# Patient Record
Sex: Male | Born: 1980 | Race: White | Hispanic: No | Marital: Married | Smoking: Never smoker
Health system: Southern US, Community
[De-identification: ages and names within clinical notes are randomized; demographics above are authoritative.]

## PROBLEM LIST (undated history)

## (undated) DIAGNOSIS — I10 Essential (primary) hypertension: Secondary | ICD-10-CM

## (undated) DIAGNOSIS — T883XXA Malignant hyperthermia due to anesthesia, initial encounter: Secondary | ICD-10-CM

## (undated) DIAGNOSIS — J45909 Unspecified asthma, uncomplicated: Secondary | ICD-10-CM

## (undated) HISTORY — PX: LYMPHADENECTOMY: SHX15

---

## 2008-03-29 ENCOUNTER — Emergency Department
Admission: EM | Admit: 2008-03-29 | Disposition: A | Discharge status: Home / Self Care | Payer: Self-pay | Source: Ambulatory Visit

## 2009-03-27 ENCOUNTER — Emergency Department
Admission: EM | Admit: 2009-03-27 | Disposition: A | Discharge status: Home / Self Care | Payer: Self-pay | Source: Ambulatory Visit

## 2016-11-12 ENCOUNTER — Ambulatory Visit (INDEPENDENT_AMBULATORY_CARE_PROVIDER_SITE_OTHER): Payer: Worker's Compensation

## 2016-11-12 ENCOUNTER — Ambulatory Visit
Admission: EM | Admit: 2016-11-12 | Discharge: 2016-11-12 | Disposition: A | Discharge status: Home / Self Care | Payer: Worker's Compensation | Attending: Family Medicine | Admitting: Family Medicine

## 2016-11-12 DIAGNOSIS — M79641 Pain in right hand: Secondary | ICD-10-CM

## 2016-11-12 DIAGNOSIS — X503XXA Overexertion from repetitive movements, initial encounter: Secondary | ICD-10-CM

## 2016-11-12 DIAGNOSIS — S63650A Sprain of metacarpophalangeal joint of right index finger, initial encounter: Secondary | ICD-10-CM | POA: Diagnosis not present

## 2016-11-12 MED ORDER — NAPROXEN 500 MG PO TABS: 500.0000 mg | ORAL_TABLET | Freq: Two times a day (BID) | ORAL | 0 refills | Status: AC

## 2016-11-12 NOTE — ED Provider Notes (Signed)
[facility]    CSN: [id number] Arrival date & time: 11/12/16  1207     History   Chief Complaint Chief Complaint  Patient presents with  . Hand Pain    HPI Eric Booth is a 36 y.o. male.   HPI  This is a 36 year old male who presents with complaints of right hand pain indicating his second MCP joint both dorsally and volarly. He works at [employer]. He states he was lifting a large bag of bunions after lifting repetitively heavy vegetables last night around 5:20 PM. He states that he did not injure his hand but felt a burning sensation radiating through his index finger into his palm and also over the dorsum. He's been applying ice to his hand since the injury last night approximately every 2 hours. He has not taken any medications. He states it is uncomfortable to make a fist or extend his fingers fully. There is no swelling or discoloration present. He went to work this morning his supervisors that he could not perform his duties and he recommended that he be seen        History reviewed. No pertinent past medical history.  There are no active problems to display for this patient.   Past Surgical History:  Procedure Laterality Date  . LYMPHADENECTOMY         Home Medications    Prior to Admission medications   Medication Sig Start Date End Date Taking? Authorizing Provider  naproxen (NAPROSYN) 500 MG tablet Take 1 tablet (500 mg total) by mouth 2 (two) times daily. 11/12/16   Lutricia Feil, PA-C    Family History Family History  Problem Relation Age of Onset  . Bipolar disorder Mother     Social History Social History  Substance Use Topics  . Smoking status: Never Smoker  . Smokeless tobacco: Never Used  . Alcohol use Yes     Comment: occ     Allergies   Patient has no known allergies.   Review of Systems Review of Systems  Constitutional: Positive for activity change. Negative for appetite change, chills, fatigue  and fever.  Musculoskeletal: Positive for myalgias.  All other systems reviewed and are negative.    Physical Exam Triage Vital Signs ED Triage Vitals  Enc Vitals Group     BP 11/12/16 1246 104/72     Pulse Rate 11/12/16 1246 80     Resp 11/12/16 1246 16     Temp 11/12/16 1246 98.7 F (37.1 C)     Temp Source 11/12/16 1246 Oral     SpO2 11/12/16 1246 96 %     Weight 11/12/16 1249 146 lb (66.2 kg)     Height 11/12/16 1249 6' (1.829 m)     Head Circumference --      Peak Flow --      Pain Score 11/12/16 1249 5     Pain Loc --      Pain Edu? --      Excl. in GC? --    No data found.   Updated Vital Signs BP 104/72 (BP Location: Left Arm)   Pulse 80   Temp 98.7 F (37.1 C) (Oral)   Resp 16   Ht 6' (1.829 m)   Wt 146 lb (66.2 kg)   SpO2 96%   BMI 19.80 kg/m   Visual Acuity Right Eye Distance:   Left Eye Distance:   Bilateral Distance:    Right Eye Near:   Left Eye  Near:    Bilateral Near:     Physical Exam  Constitutional: He appears well-developed and well-nourished. No distress.  HENT:  Head: Normocephalic.  Eyes: Pupils are equal, round, and reactive to light.  Neck: Normal range of motion. Neck supple.  Musculoskeletal: He exhibits tenderness. He exhibits no deformity.  Semination of the right dominant hand is very mild swelling over the second MCP joint. There is no ecchymosis or erythema present. He has fairly good range of motion passively and less so actively. FDS and FDP are intact and strong. There is mild tenderness to bilateral compression of the index MCP joint. He has good extension of his fingers and hand is strong to clinical stressing as well. His wrist range of motion is full and comfortable as his elbow and shoulder. Sensation is intact to light touch  Skin: He is not diaphoretic.  Nursing note and vitals reviewed.    UC Treatments / Results  Labs (all labs ordered are listed, but only abnormal results are displayed) Labs Reviewed - No  data to display  EKG  EKG Interpretation None       Radiology Dg Hand Complete Right  Result Date: 11/12/2016 CLINICAL DATA:  Injured hand yesterday at work. Pain involving the second MCP joint. EXAM: RIGHT HAND - COMPLETE 3+ VIEW COMPARISON:  None. FINDINGS: The joint spaces are maintained. No acute fractures identified. Mild degenerative changes noted at the second MCP joint with spurring and possible small erosions. There is also a small suspected erosion along the ulnar aspect of the proximal phalanx of the ring finger. The wrist joints appear normal. IMPRESSION: No acute bony findings. 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 Electronically Signed   By: Rudie MeyerP.  Gallerani M.D.   On: 11/12/2016 13:51    Procedures Procedures (including critical care time)  Medications Ordered in UC Medications - No data to display A dorsal splint was applied over the index finger immobilizing the MCP joint  Initial Impression / Assessment and Plan / UC Course  I have reviewed the triage vital signs and the nursing notes.  Pertinent labs & imaging results that were available during my care of the patient were reviewed by me and considered in my medical decision making (see chart for details).    Plan: 1. Test/x-ray results and diagnosis reviewed with patient 2. rx as per orders; risks, benefits, potential side effects reviewed with patient 3. Recommend supportive treatment with the icing and use of the splint while at work. Restrictions were provided for 7 days. If the patient continues to have pain after 7 days he will follow-up with Glenard Haringommy Moore, FNP at [facility]. He was given a prescription for Naprosyn which she will use for discomfort and swelling. 4. F/u prn if symptoms worsen or don't improve     Final Clinical Impressions(s) / UC Diagnoses   Final diagnoses:  Sprain of metacarpophalangeal (MCP) joint of right index finger, initial encounter    New  Prescriptions Discharge Medication List as of 11/12/2016  2:15 PM    START taking these medications   Details  naproxen (NAPROSYN) 500 MG tablet Take 1 tablet (500 mg total) by mouth 2 (two) times daily., Starting Wed 11/12/2016, Normal         Controlled Substance Prescriptions [place] Controlled Substance Registry consulted? Not Applicable   Lutricia FeilRoemer, William P, PA-C 11/12/16 2131

## 2016-11-12 NOTE — ED Triage Notes (Signed)
36 year old Caucasian male is here today with complaints of right hand pain from picking up a large bag of onions at his job last night. He states right after he picked up the large bag of onions he felt pain over his right index knuckle. He states he did not hit his hand against anything. He states he has been icing the area with no relief. He states he has not taken any OTC medications for the pain either.

## 2016-11-12 NOTE — Discharge Instructions (Signed)
Follow up next week with Glenard Haringommy Moore, F[facility] at [facility]. Wear splint at work.

## 2017-08-16 ENCOUNTER — Emergency Department
Admission: EM | Admit: 2017-08-16 | Discharge: 2017-08-17 | Disposition: A | Discharge status: Home / Self Care | Payer: Self-pay | Attending: Emergency Medicine | Admitting: Emergency Medicine

## 2017-08-16 DIAGNOSIS — L500 Allergic urticaria: Secondary | ICD-10-CM | POA: Insufficient documentation

## 2017-08-16 DIAGNOSIS — T7840XA Allergy, unspecified, initial encounter: Secondary | ICD-10-CM

## 2017-08-16 HISTORY — DX: Essential (primary) hypertension: I10

## 2017-08-16 HISTORY — DX: Unspecified asthma, uncomplicated: J45.909

## 2017-08-16 MED ORDER — FAMOTIDINE 10 MG/ML IV SOLN (WRAP)
20.0000 mg | Freq: Once | INTRAVENOUS | Status: AC
Start: 2017-08-16 — End: 2017-08-16
  Administered 2017-08-16: 20 mg via INTRAVENOUS
  Filled 2017-08-16: qty 2

## 2017-08-16 MED ORDER — ALBUTEROL SULFATE (2.5 MG/3ML) 0.083% IN NEBU
2.5000 mg | INHALATION_SOLUTION | Freq: Once | RESPIRATORY_TRACT | Status: AC
Start: 2017-08-16 — End: 2017-08-16
  Administered 2017-08-16: 2.5 mg via RESPIRATORY_TRACT
  Filled 2017-08-16: qty 3

## 2017-08-16 MED ORDER — METHYLPREDNISOLONE SODIUM SUCC 125 MG IJ SOLR
125.0000 mg | Freq: Once | INTRAMUSCULAR | Status: AC
Start: 2017-08-16 — End: 2017-08-16
  Administered 2017-08-16: 125 mg via INTRAVENOUS
  Filled 2017-08-16: qty 2

## 2017-08-16 MED ORDER — PREDNISONE 20 MG PO TABS
60.0000 mg | ORAL_TABLET | Freq: Once | ORAL | Status: DC
Start: 2017-08-16 — End: 2017-08-16

## 2017-08-16 MED ORDER — IPRATROPIUM BROMIDE 0.02 % IN SOLN
0.5000 mg | Freq: Once | RESPIRATORY_TRACT | Status: AC
Start: 2017-08-16 — End: 2017-08-16
  Administered 2017-08-16: 0.5 mg via RESPIRATORY_TRACT
  Filled 2017-08-16: qty 2.5

## 2017-08-16 MED ORDER — SODIUM CHLORIDE 0.9 % IV BOLUS
1000.0000 mL | Freq: Once | INTRAVENOUS | Status: AC
Start: 2017-08-16 — End: 2017-08-17
  Administered 2017-08-16: 1000 mL via INTRAVENOUS

## 2017-08-16 NOTE — ED Provider Notes (Signed)
I evaluated the patient in the ED triage booth only. I transferred care to the next ED physician.   Time of Initial Evaluation:: 10:25 PM  Emergency Department - Initial Evaluation in Triage Only     Selected Historical Findings  Chief Complaint   Patient presents with   . Allergic Reaction     Ernest May is a 37 y.o. male who presents with suspected allergic reaction.  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  Ate 2pm.  Felt itchy.  Then better.  No worse.  Eye swelling. Mild DIB.  Took 50mg  benadryl ~ ago.  Past History  No past medical history on file.  No past surgical history on file.  PCP - No primary care provider on file.     Selected Physical Examination Findings  BP 118/66   Pulse 87   Resp 19   Ht 5\' 11"  (1.803 m)   Wt 72.6 kg   SpO2 97%   BMI 22.32 kg/m   Constitutional: Vital signs reviewed.   Eyes swollen.  Facial urticaria  Voice normal  OP minimally injected  No wheezing      Medical Decision Making/Comments:   Requested patient to be room.  Prednisone ordered.      I am not working with a Neurosurgeon.     Ruffin Pyo, MD  08/16/17 2238

## 2017-08-16 NOTE — ED Provider Notes (Addendum)
[facility] EMERGENCY DEPARTMENT H&P            CLINICAL INFORMATION        HPI:      Chief Complaint: Allergic Reaction  .    Ernest May is a 37 y.o. male with a pmhx of HTN who presents with a sudden onset of bilateral eye swelling a/w hives and generalized itching that started at 2pm.     Tinnie Gens ate at a [employer] today. Around 2pm, felt the reaction starting. His eyes became swollen and he felt as though he was itching all over. Took 50mg  of Benadryl around 2150 as he felt the reaction coming back.     Denies: difficulty breathing, known food allergies    History obtained from: Patient              ROS:        Review of Systems   Constitutional: Negative for fatigue and fever.   HENT: Negative for congestion, sore throat and trouble swallowing.    Eyes: Positive for pain. Negative for photophobia and redness.        Eye swelling   Respiratory: Negative for shortness of breath and wheezing.    Cardiovascular: Negative for chest pain and leg swelling.   Gastrointestinal: Negative for abdominal pain, nausea and vomiting.   Genitourinary: Negative for dysuria and urgency.   Musculoskeletal: Negative for back pain and joint swelling.   Skin: Positive for rash. Negative for color change.        Itching   Neurological: Negative for weakness and headaches.                 Physical Exam:      BP: 118/66, Heart Rate: 87, Resp Rate: 19, SpO2: 97 %, Height: 5\' 11"  (180.3 cm), Weight: 72.6 kg    Physical Exam   Constitutional: He is oriented to person, place, and time. He appears well-developed and well-nourished.   HENT:   Head: Normocephalic and atraumatic.   No swelling of the lips, tongue or soft palate   Eyes: Pupils are equal, round, and reactive to light. EOM are normal.   Periorbital swelling   Neck: Normal range of motion. Neck supple. No tracheal deviation present.   Cardiovascular: Normal rate, regular rhythm and normal heart sounds.    No murmur heard.  Pulmonary/Chest: Effort normal. No  respiratory distress. He has wheezes in the right lower field and the left lower field.   Abdominal: Soft. Bowel sounds are normal. He exhibits no distension. There is no tenderness.   Musculoskeletal: Normal range of motion.   Neurological: He is alert and oriented to person, place, and time.   Skin: Skin is warm and dry. Rash noted. Rash is urticarial (Diffuse, trunk, arms, legs).                     PAST HISTORY        Primary Care Provider: Pcp, None, MD        PMH/PSH:    .     Past Medical History:   Diagnosis Date   . Asthma    . Hypertension        He has no past surgical history on file.      Social/Family History:      He reports that he has never smoked. He has never used smokeless tobacco. He reports that he drinks alcohol. He reports that he does not use drugs.  History reviewed. No pertinent family history.      Listed Medications on Arrival:    .     Previous Medications    No medications on file      Allergies: He has No Known Allergies.            VISIT INFORMATION        Medications Given in the ED:    .     ED Medication Orders     Start Ordered     Status Ordering Provider    08/16/17 2241 08/16/17 2240  sodium chloride 0.9 % bolus 1,000 mL  Once     Route: Intravenous  Ordered Dose: 1,000 mL     Last MAR action:  New Bag Margarete Horace BRIAN    08/16/17 2241 08/16/17 2240  methylPREDNISolone sodium succinate (Solu-MEDROL) injection 125 mg  Once     Route: Intravenous  Ordered Dose: 125 mg     Last MAR action:  Given Mieka Leaton BRIAN    08/16/17 2241 08/16/17 2240  famotidine (PEPCID) injection 20 mg  Once     Route: Intravenous  Ordered Dose: 20 mg     Last MAR action:  Given Krysti Hickling BRIAN    08/16/17 2241 08/16/17 2240  albuterol (PROVENTIL) (2.5 MG/3ML) 0.083% nebulizer solution 2.5 mg  RT - Once     Route: Nebulization  Ordered Dose: 2.5 mg     Last MAR action:  Given Vollie Brunty BRIAN    08/16/17 2241 08/16/17 2240  ipratropium (ATROVENT) 0.02  % nebulizer solution 0.5 mg  RT - Once     Route: Nebulization  Ordered Dose: 0.5 mg     Last MAR action:  Given Daijanae Rafalski BRIAN    08/16/17 2230 08/16/17 2229    Once     Route: Oral  Ordered Dose: 60 mg     Discontinued AVSTREIH, DAN B            Procedures:      Procedures      Interpretations:      O2 sat-           saturation: 97 %; Oxygen use: room air; Interpretation: Normal                         CLINICAL SUMMARY          MDM Notes:      37 year old male presents ED with apparent allergic reaction, the patient has urticaria, he has some swelling of the eyes bilaterally, he has no signs of anaphylaxis including no swelling of the lips, tongue or soft palate.  He has no stridor.  He does have some minor wheezing, and a history of asthma we will give albuterol, prednisone, Pepcid.  Patient took Benadryl already.    .     ED Course:    Patient was observed for several hours in the ED, he had improvement in his symptoms, he never had any airway compromise, will be discharged home with Medrol Dosepak, Benadryl, EpiPen advised to follow-up with transitional care clinic and medicine for allergy testing.  Should return to the ED for new or concerning symptoms.                 Diagnosis:        Final diagnoses:   Allergic reaction, initial encounter         Disposition:      ED Disposition  ED Disposition Condition Date/Time Comment    Discharge  Mon Aug 17, 2017 12:39 AM Dimple Casey discharge to home/self care.    Condition at disposition: Stable          Condition at disposition:  Good    New Prescriptions    DIPHENHYDRAMINE (BENADRYL) 25 MG TABLET    Take 1 tablet (25 mg total) by mouth every 6 (six) hours as needed for Itching    EPINEPHRINE 0.3 MG/0.3ML SOLUTION AUTO-INJECTOR INJECTION    Inject 0.3 mLs (0.3 mg total) into the muscle as needed (for Anaphylaxis)    METHYLPREDNISOLONE (MEDROL DOSPACK) 4 MG TABLET    Use as directed               RESULTS        Lab Results:      Results      ** No results found for the last 24 hours. **              Radiology Results:      No orders to display             Scribe Attestation:      I was acting as a Neurosurgeon for Tera Partridge, MD on Dimple Casey  Treatment Team: Scribe: Courtney Heys    I am the first provider for this patient and I personally performed the services documented. Treatment Team: Scribe: Courtney Heys is scribing for me on Seamans,Jaikob ALAN. This note accurately reflects work and decisions made by me.  Tera Partridge, MD            Theresa Duty, MD  08/17/17 1610       Theresa Duty, MD  08/17/17 [phone]

## 2017-08-16 NOTE — ED Triage Notes (Signed)
States that he had seafood allergy until he was ~age 37.  Has been eating seafood weekly since w/ no problem

## 2017-08-17 MED ORDER — EPINEPHRINE 0.3 MG/0.3ML IJ SOAJ
0.3000 mg | INTRAMUSCULAR | 0 refills | Status: AC | PRN
Start: 2017-08-17 — End: ?

## 2017-08-17 MED ORDER — DIPHENHYDRAMINE HCL 25 MG PO TABS
25.0000 mg | ORAL_TABLET | Freq: Four times a day (QID) | ORAL | 0 refills | Status: AC | PRN
Start: 2017-08-17 — End: ?

## 2017-08-17 MED ORDER — METHYLPREDNISOLONE 4 MG PO TBPK
ORAL_TABLET | ORAL | 0 refills | Status: AC
Start: 2017-08-17 — End: ?

## 2017-08-17 NOTE — ED Notes (Signed)
Pt resting in stretcher, no distress noted at this time

## 2017-08-17 NOTE — Discharge Instructions (Signed)
Dear Mr. Ernest May:    Thank you for choosing the [facility] Emergency Department, the premier emergency department in the [place] area.  I hope your visit today was EXCELLENT.    Specific instructions for your visit today:      Allergic Reaction    You have been seen for an allergic reaction.    An allergic reaction is when your body reacts to something it comes in contact with. This can be something you ate. It can also be something that got on your skin or that you breathed in. Insect bites sometimes cause this reaction. Wasp, hornet and bee stings often cause this reaction.    Allergic reactions can cause a few things to happen. Some people get hives (large raised welts). Others get blistered skin. More serious reactions include swelling of the lips and/or tongue. They also include difficulty breathing. This can be with or without wheezing.    Often, the exact cause of the allergic reaction is never found.    If you find you are allergic to something, avoid it. Future allergic reactions could get much worse.    General treatment for an allergic reaction includes antihistamines like diphenhydramine (Benadryl) or prescription strength hydroxyzine (Atarax) and steroids. Some antacids also act like antihistamines. These include famotidine (Pepcid), ranitidine (Zantac) or cimetidine (Tagamet). These are often used to help with the allergic reaction.    If you get a steroid prescription, it is important to finish the entire prescription. The allergic reaction can come back suddenly (rebound) if you suddenly stop the steroid too early.    YOU SHOULD SEEK MEDICAL ATTENTION IMMEDIATELY, EITHER HERE OR AT THE NEAREST EMERGENCY DEPARTMENT, IF ANY OF THE FOLLOWING OCCURS:   Your lips or tongue get swollen.   You have trouble breathing or start wheezing.   Your rash seems to get infected. Signs of infection are skin redness, pain, pus, swelling or fever (temperature higher than 100.49F / 38C).    If you  do not continue to improve or your condition worsens, please contact your doctor or return immediately to the Emergency Department.    Sincerely,  Sponaugle, [name]*  Attending Emergency Physician  [facility] Emergency Department    ONSITE PHARMACY  Our full service onsite pharmacy is located in the ER waiting room.  Open 7 days a week from 9 am to 9 pm.  We accept all major insurances and prices are competitive with major retailers.  Ask your provider to print your prescriptions down to the pharmacy to speed you on your way home.    OBTAINING A PRIMARY CARE APPOINTMENT    Primary care physicians (PCPs, also known as primary care doctors) are either internists or family medicine doctors. Both types of PCPs focus on health promotion, disease prevention, patient education and counseling, and treatment of acute and chronic medical conditions.    Call for an appointment with a primary care doctor.  Ask to see who is taking new patients.     [facility]  telephone:  [phone]  [email or web address]    DOCTOR REFERRALS  Call [phone] (available 24 hours a day, 7 days a week) if you need any further referrals and we can help you find a primary care doctor or specialist.  Also, available online at:  [email or web address]    YOUR CONTACT INFORMATION  Before leaving please check with registration to make sure we have an up-to-date contact number.  You can call registration at [phone] to update  your information.  For questions about your hospital bill, please call [phone].  For questions about your Emergency Dept Physician bill please call [phone].      FREE HEALTH SERVICES  If you need help with health or social services, please call 2-1-1 for a free referral to resources in your area.  2-1-1 is a free service connecting people with information on health insurance, free clinics, pregnancy, mental health, dental care, food assistance, housing, and substance  abuse counseling.  Also, available online at:  http://www.[facility].org    MEDICAL RECORDS AND TESTS  Certain laboratory test results do not come back the same day, for example urine cultures.   We will contact you if other important findings are noted.  Radiology films are often reviewed again to ensure accuracy.  If there is any discrepancy, we will notify you.      Please call [phone] to pick up a complimentary CD of any radiology studies performed.  If you or your doctor would like to request a copy of your medical records, please call [phone].      ORTHOPEDIC INJURY   Please know that significant injuries can exist even when an initial x-ray is read as normal or negative.  This can occur because some fractures (broken bones) are not initially visible on x-rays.  For this reason, close outpatient follow-up with your primary care doctor or bone specialist (orthopedist) is required.    MEDICATIONS AND FOLLOWUP  Please be aware that some prescription medications can cause drowsiness.  Use caution when driving or operating machinery.    The examination and treatment you have received in our Emergency Department is provided on an emergency basis, and is not intended to be a substitute for your primary care physician.  It is important that your doctor checks you again and that you report any new or remaining problems at that time.      24 HOUR PHARMACIES  The nearest 24 hour pharmacy is:    [facility]  [address]  [place], [place] [address]  [phone]      ASSISTANCE WITH INSURANCE    Affordable Care Act  [facility])  Call to start or finish an application, compare plans, enroll or ask a question.  [phone]  TTY: 9375819080  Web:  Healthcare.gov    Help Enrolling in [facility]  [employer] [place]  985-483-6940 (TOLL-FREE)  [phone] (TTY)  Web:  Http://www.coverva.org    Local Help Enrolling in the [facility]  [place] [facility]  [phone] (MAIN)  Email:   health-help@nvfs .org  Web:  BlackjackMyths.is  Address:  [address] [place], [place] [address]    SEDATING MEDICATIONS  Sedating medications include strong pain medications (e.g. narcotics), muscle relaxers, benzodiazepines (used for anxiety and as muscle relaxers), Benadryl/diphenhydramine and other antihistamines for allergic reactions/itching, and other medications.  If you are unsure if you have received a sedating medication, please ask your physician or nurse.  If you received a sedating medication: DO NOT drive a car. DO NOT operate machinery. DO NOT perform jobs where you need to be alert.  DO NOT drink alcoholic beverages while taking this medicine.     If you get dizzy, sit or lie down at the first signs. Be careful going up and down stairs.  Be extra careful to prevent falls.     Never give this medicine to others.     Keep this medicine out of reach of children.  Do not take or save old medicines. Throw them away when outdated.     Keep all medicines in a cool, dry place. DO NOT keep them in your bathroom medicine cabinet or in a cabinet above the stove.    MEDICATION REFILLS  Please be aware that we cannot refill any prescriptions through the ER. If you need further treatment from what is provided at your ER visit, please follow up with your primary care doctor or your pain management specialist.    [place]  Did you know [facility] has two freestanding ERs located just a few miles away?  [facility] [place] have short wait times, easy free parking directly in front of the building and top patient satisfaction scores - and the same Board Certified Emergency Medicine doctors as [facility].

## 2018-08-10 IMAGING — CR DG HAND COMPLETE 3+V*R*
3 series · 3 of 3 positions shown · non-contrast
Comparison: None.

CLINICAL DATA: Injured hand yesterday at work. Pain involving the
second MCP joint.

EXAM:
RIGHT HAND - COMPLETE 3+ VIEW

[hand ap]
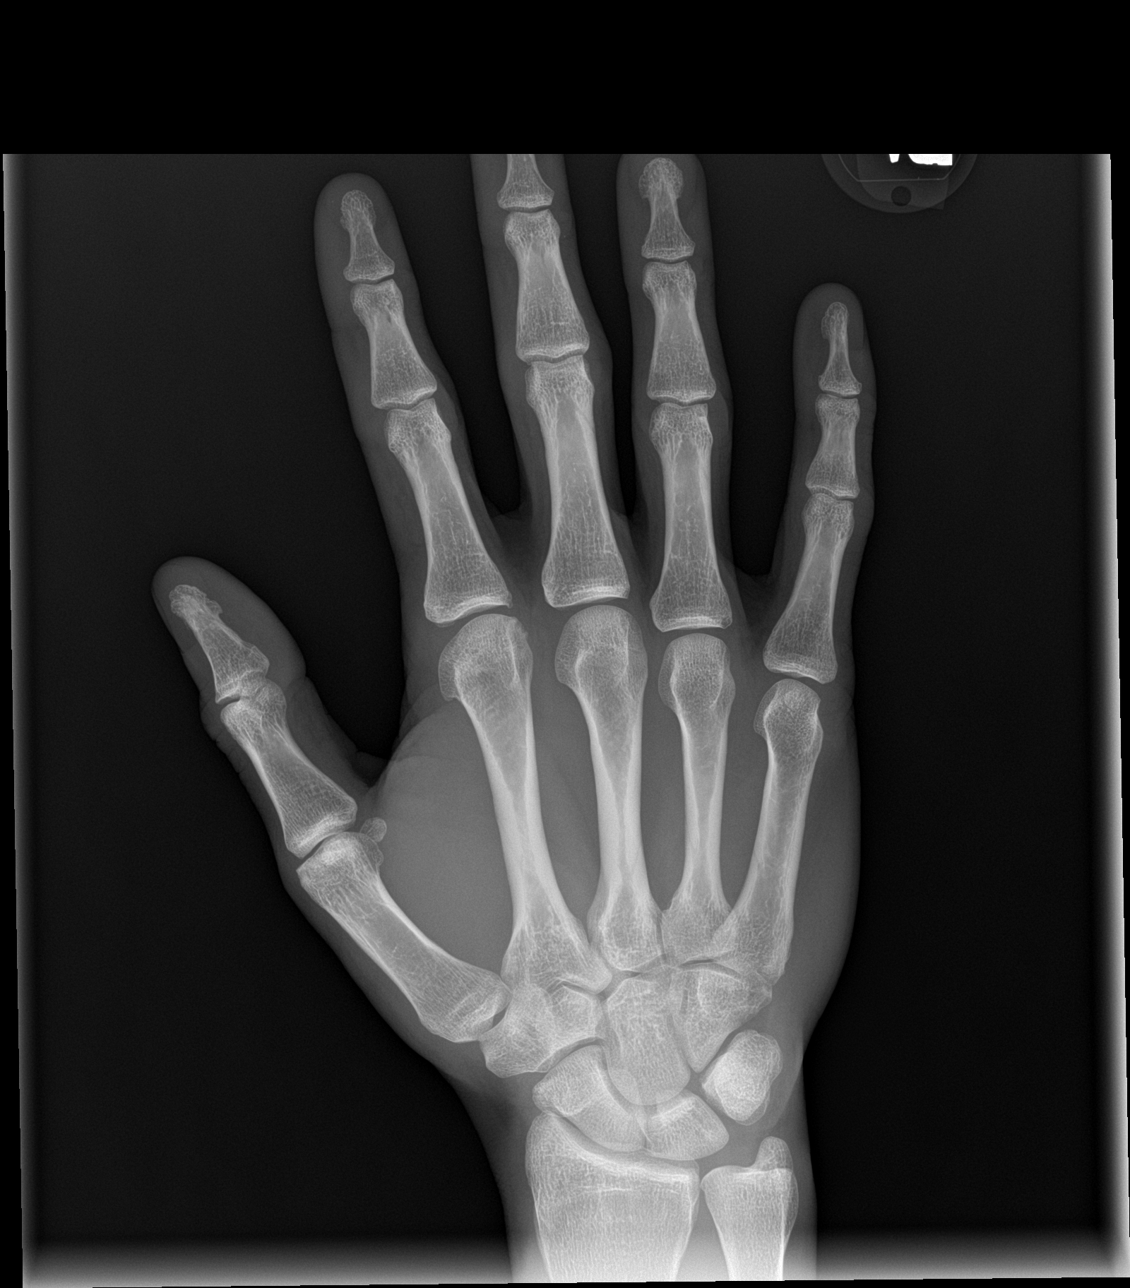

[hand obl]
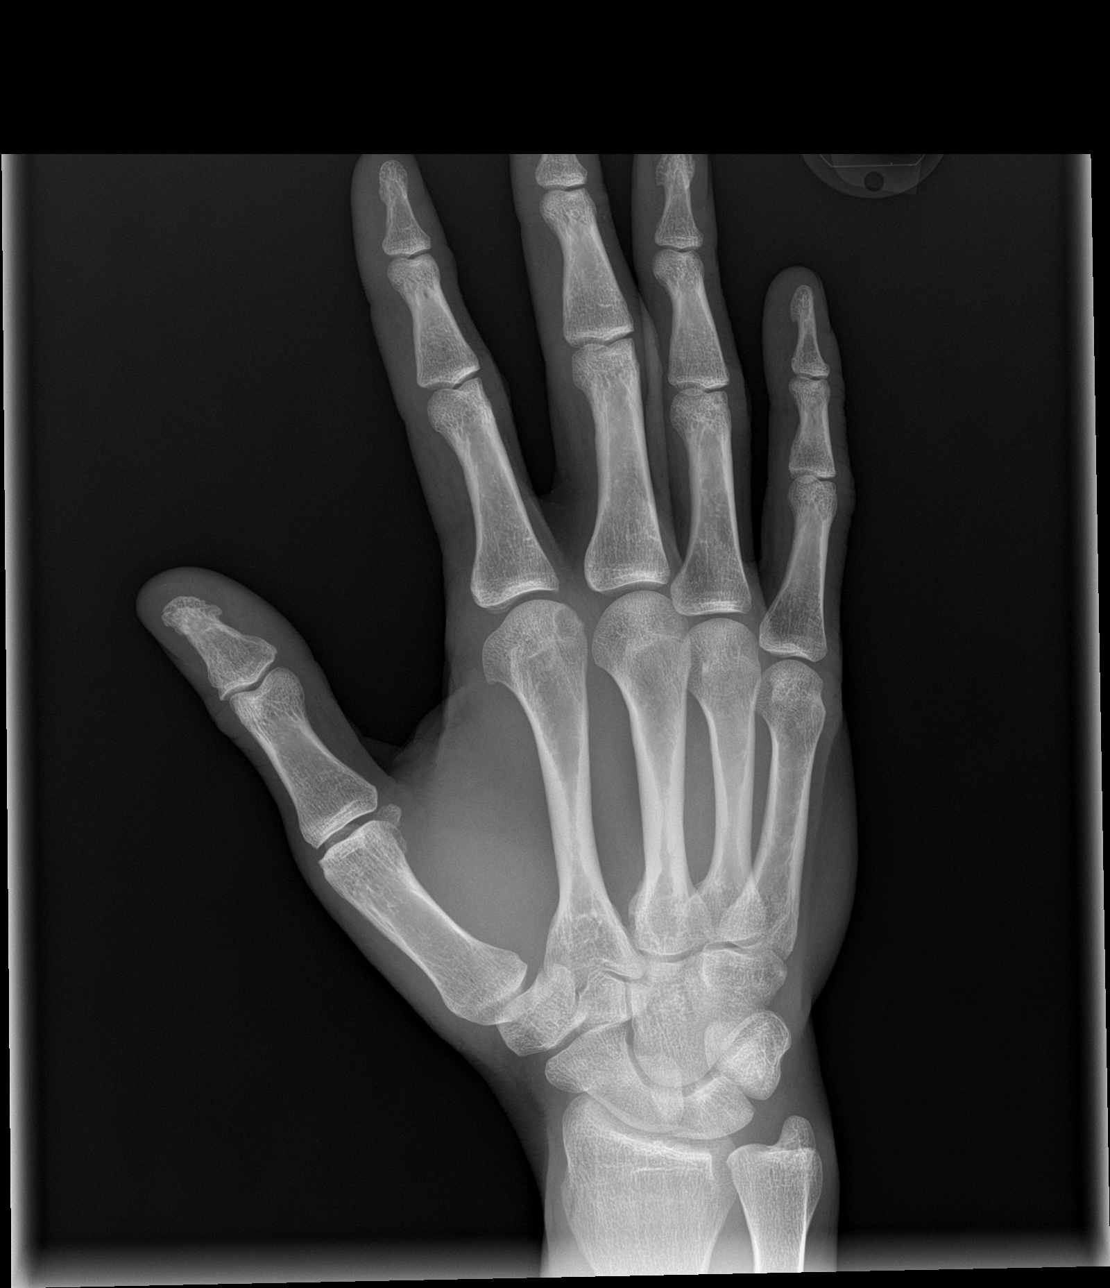

[hand lat]
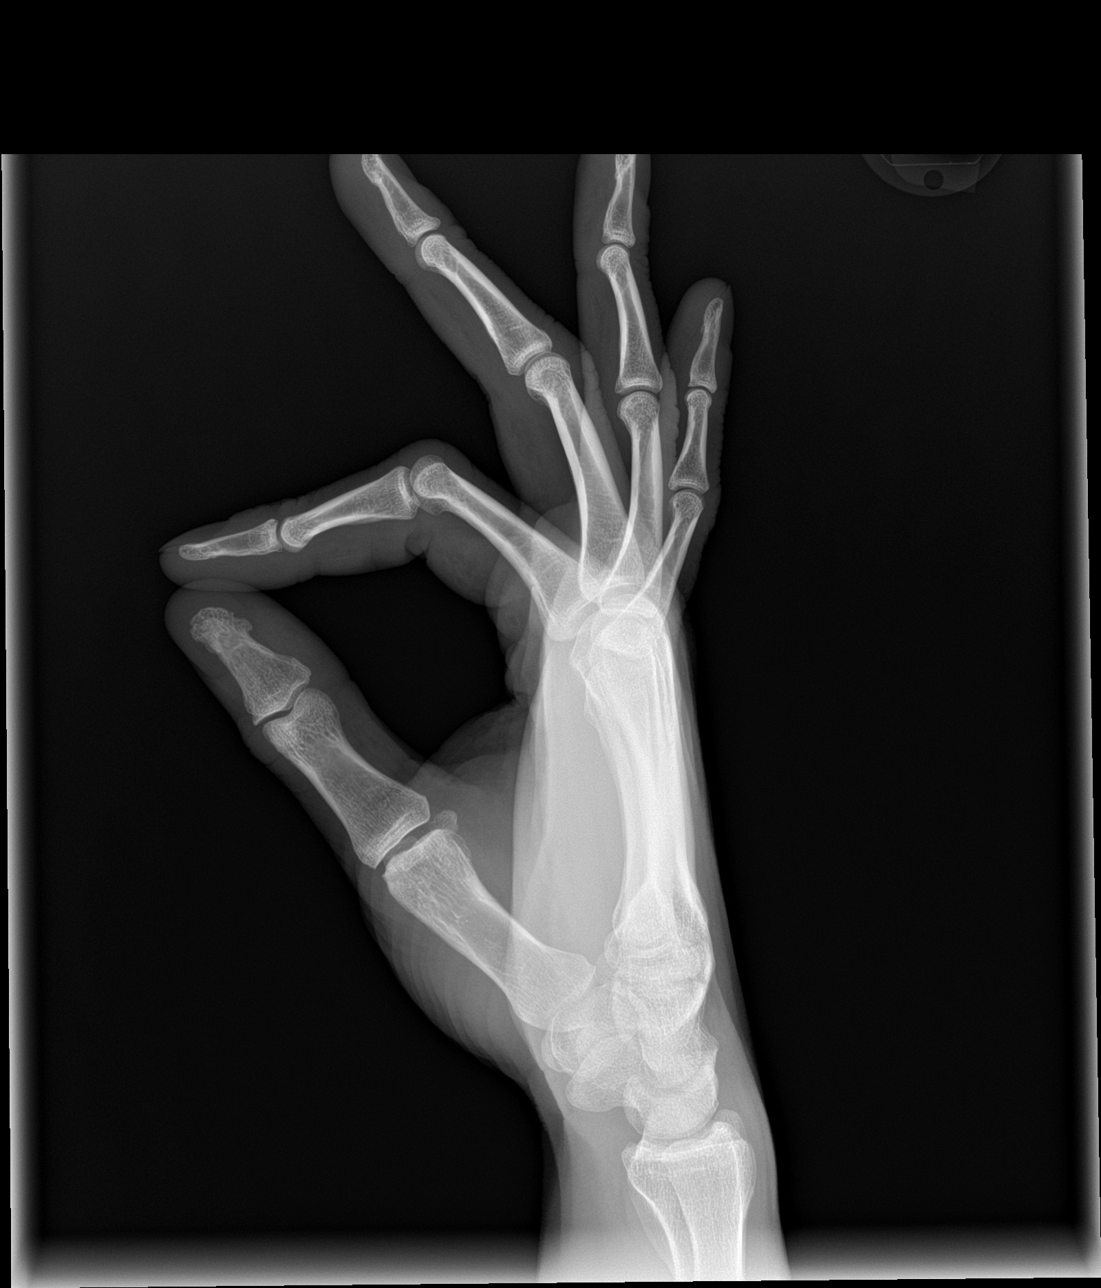

[3 of 3 positions shown; findings below may reference images not displayed]

FINDINGS: The joint spaces are maintained. No acute fractures identified. Mild
degenerative changes noted at the second MCP joint with spurring and
possible small erosions. There is also a small suspected erosion
along the ulnar aspect of the proximal phalanx of the ring finger.
The wrist joints appear normal.
IMPRESSION: No acute bony findings.

Degenerative changes at the second MCP joint.

## 2022-03-19 ENCOUNTER — Other Ambulatory Visit: Payer: Self-pay

## 2022-03-19 DIAGNOSIS — S83211A Bucket-handle tear of medial meniscus, current injury, right knee, initial encounter: Secondary | ICD-10-CM | POA: Insufficient documentation

## 2022-03-19 NOTE — H&P (Signed)
Patient Type: A    ATTENDING PHYSICIAN: Korin Hartwell W. Aylanie Cubillos    CHIEF COMPLAINT:  Right knee medial meniscus tear.    BRIEF HISTORY:  The patient is the 41 year old male with an injury to his right knee on   February 20, 2022.  He has an MRI which shows a bucket handle tear of his   medial meniscus and now presents for an elective right knee arthroscopy   with medial meniscectomy.    PAST MEDICAL HISTORY:  Significant for a family history of malignant hyperthermia, also has a   history of asthma, and hypertension.    PAST SURGICAL HISTORY:  Otherwise, negative.    MEDICATIONS:  None.    ALLERGIES:  No known drug allergies, but he does have a questionable allergy to   propofol and questionable history of malignant hyperthermia.    FAMILY HISTORY:  Significant for heart disease.    SOCIAL HISTORY:  Negative for tobacco and alcohol use.    REVIEW OF SYSTEMS:  Otherwise negative for glaucoma, sinusitis, heart disease, high blood   pressure, dermatitis, seizures, stomach ulcers, emphysema, rheumatoid   arthritis, anxiety, depression, thyroid disease, hepatitis, frequent   bladder infections, incontinence, cancer.    PHYSICAL EXAMINATION:  GENERAL:  Healthy male, in no acute distress.  VITAL SIGNS:  Height 5 feet 11 inches, weight is 167.  BMI is 23.  HEART:  Regular rate and rhythm.  LUNGS:  Clear to auscultation.  EXTREMITIES:  Examination of right knee today, there is no effusion.    Medial joint line is exquisitely tender to palpation.  Limited range of   motion from 10 to about 70 degrees of flexion.    DIAGNOSTIC DATA:  MRI of his right knee from [facility] clearly shows he has a displaced   bucket handle tear of his medial meniscus. No other obvious abnormalities   seen.    ASSESSMENT:  A 41 year old male with a right knee bucket handle tear of his medial   meniscus.  At this point, he is set up to have a right knee arthroscopy for  partial medial meniscectomy.  Risks of surgery including bleeding,   infection,  damage to blood vessels and nerves, deep venous thrombosis,   postoperative stiffness.  He understands and is set up for that on the day   of admission.      D: 03/19/2022 03:01 PM by Theador Hawthorne, MD [phone])  T: 03/19/2022 03:14 PM by [id number] (Conf: [id number]) (Doc ID:   [id number])

## 2022-03-23 ENCOUNTER — Ambulatory Visit
Admission: AD | Admit: 2022-03-23 | Discharge: 2022-03-23 | Disposition: A | Discharge status: Home / Self Care | Payer: BC Managed Care – PPO | Attending: Orthopaedic Surgery | Admitting: Orthopaedic Surgery

## 2022-03-23 ENCOUNTER — Ambulatory Visit: Payer: BC Managed Care – PPO | Admitting: Anesthesiology

## 2022-03-23 ENCOUNTER — Encounter: Payer: Self-pay | Admitting: Orthopaedic Surgery

## 2022-03-23 ENCOUNTER — Encounter
Admission: AD | Disposition: A | Discharge status: Home / Self Care | Payer: Self-pay | Source: Home / Self Care | Attending: Orthopaedic Surgery

## 2022-03-23 DIAGNOSIS — X58XXXA Exposure to other specified factors, initial encounter: Secondary | ICD-10-CM | POA: Insufficient documentation

## 2022-03-23 DIAGNOSIS — S83211A Bucket-handle tear of medial meniscus, current injury, right knee, initial encounter: Secondary | ICD-10-CM | POA: Insufficient documentation

## 2022-03-23 HISTORY — PX: ARTHROSCOPY KNEE, MENISECTOMY: SHX51039

## 2022-03-23 HISTORY — DX: Malignant hyperthermia due to anesthesia, initial encounter: T88.3XXA

## 2022-03-23 SURGERY — ARTHROSCOPY KNEE, MENISECTOMY
Anesthesia: Anesthesia General | Site: Knee | Laterality: Right | Wound class: Clean

## 2022-03-23 MED ORDER — OXYCODONE HCL 5 MG PO TABS
ORAL_TABLET | ORAL | Status: AC
Start: 2022-03-23 — End: 2022-03-23
  Administered 2022-03-23: 5 mg via ORAL
  Filled 2022-03-23: qty 1

## 2022-03-23 MED ORDER — STERILE WATER FOR INJECTION IJ/IV SOLN (WRAP)
2.0000 g | INTRAMUSCULAR | Status: AC
Start: 2022-03-23 — End: 2022-03-23
  Administered 2022-03-23: 2 g via INTRAVENOUS

## 2022-03-23 MED ORDER — PROPOFOL 10 MG/ML IV EMUL (WRAP)
INTRAVENOUS | Status: AC
Start: 2022-03-23 — End: ?
  Filled 2022-03-23: qty 20

## 2022-03-23 MED ORDER — LIDOCAINE HCL (PF) 1 % IJ SOLN
INTRAMUSCULAR | Status: AC
Start: 2022-03-23 — End: ?
  Filled 2022-03-23: qty 5

## 2022-03-23 MED ORDER — KETOROLAC TROMETHAMINE 30 MG/ML IJ SOLN
INTRAMUSCULAR | Status: AC
Start: 2022-03-23 — End: 2022-03-23
  Administered 2022-03-23: 30 mg via INTRAVENOUS
  Filled 2022-03-23: qty 1

## 2022-03-23 MED ORDER — FENTANYL CITRATE (PF) 50 MCG/ML IJ SOLN (WRAP)
INTRAMUSCULAR | Status: DC | PRN
Start: 2022-03-23 — End: 2022-03-23
  Administered 2022-03-23 (×4): 50 ug via INTRAVENOUS

## 2022-03-23 MED ORDER — FENTANYL CITRATE (PF) 50 MCG/ML IJ SOLN (WRAP)
INTRAMUSCULAR | Status: AC
Start: 2022-03-23 — End: ?
  Filled 2022-03-23: qty 2

## 2022-03-23 MED ORDER — OXYCODONE HCL 5 MG PO TABS
5.0000 mg | ORAL_TABLET | Freq: Once | ORAL | Status: AC | PRN
Start: 2022-03-23 — End: 2022-03-23

## 2022-03-23 MED ORDER — BUPIVACAINE HCL (PF) 0.25 % IJ SOLN
INTRAMUSCULAR | Status: DC | PRN
Start: 2022-03-23 — End: 2022-03-23
  Administered 2022-03-23: 10 mL

## 2022-03-23 MED ORDER — EPINEPHRINE HCL 1 MG/ML IJ SOLN (WRAP)
Status: AC
Start: 2022-03-23 — End: ?
  Filled 2022-03-23: qty 3

## 2022-03-23 MED ORDER — LIDOCAINE HCL 2 % IJ SOLN
INTRAMUSCULAR | Status: DC | PRN
Start: 2022-03-23 — End: 2022-03-23
  Administered 2022-03-23: 100 mg

## 2022-03-23 MED ORDER — PROPOFOL INFUSION 10 MG/ML
INTRAVENOUS | Status: DC | PRN
Start: 2022-03-23 — End: 2022-03-23
  Administered 2022-03-23: 200 mg via INTRAVENOUS
  Administered 2022-03-23: 120 ug/kg/min via INTRAVENOUS

## 2022-03-23 MED ORDER — LACTATED RINGERS IV SOLN
INTRAVENOUS | Status: DC
Start: 2022-03-23 — End: 2022-03-26

## 2022-03-23 MED ORDER — HYDROMORPHONE HCL 1 MG/ML IJ SOLN
INTRAMUSCULAR | Status: DC | PRN
Start: 2022-03-23 — End: 2022-03-23
  Administered 2022-03-23 (×2): .5 mg via INTRAVENOUS

## 2022-03-23 MED ORDER — EPINEPHRINE HCL 1 MG/ML IJ SOLN (WRAP)
Status: DC | PRN
Start: 2022-03-23 — End: 2022-03-23
  Administered 2022-03-23: 2 mg via SUBCUTANEOUS

## 2022-03-23 MED ORDER — BUPIVACAINE HCL (PF) 0.25 % IJ SOLN
INTRAMUSCULAR | Status: AC
Start: 2022-03-23 — End: ?
  Filled 2022-03-23: qty 20

## 2022-03-23 MED ORDER — HYDROMORPHONE HCL 1 MG/ML IJ SOLN
0.5000 mg | INTRAMUSCULAR | Status: DC | PRN
Start: 2022-03-23 — End: 2022-03-26

## 2022-03-23 MED ORDER — MIDAZOLAM HCL 1 MG/ML IJ SOLN (WRAP)
INTRAMUSCULAR | Status: DC | PRN
Start: 2022-03-23 — End: 2022-03-23
  Administered 2022-03-23: 2 mg via INTRAVENOUS

## 2022-03-23 MED ORDER — PROPOFOL 10 MG/ML IV EMUL (WRAP)
INTRAVENOUS | Status: AC
Start: 2022-03-23 — End: ?
  Filled 2022-03-23: qty 50

## 2022-03-23 MED ORDER — LACTATED RINGERS IV SOLN
INTRAVENOUS | Status: DC | PRN
Start: 2022-03-23 — End: 2022-03-26

## 2022-03-23 MED ORDER — HYDROMORPHONE HCL 1 MG/ML IJ SOLN
INTRAMUSCULAR | Status: AC
Start: 2022-03-23 — End: ?
  Filled 2022-03-23: qty 1

## 2022-03-23 MED ORDER — KETOROLAC TROMETHAMINE 30 MG/ML IJ SOLN
30.0000 mg | Freq: Once | INTRAMUSCULAR | Status: AC
Start: 2022-03-23 — End: 2022-03-23

## 2022-03-23 MED ORDER — ONDANSETRON HCL 4 MG/2ML IJ SOLN
4.0000 mg | Freq: Once | INTRAMUSCULAR | Status: DC | PRN
Start: 2022-03-23 — End: 2022-03-26

## 2022-03-23 MED ORDER — MIDAZOLAM HCL 1 MG/ML IJ SOLN (WRAP)
INTRAMUSCULAR | Status: AC
Start: 2022-03-23 — End: ?
  Filled 2022-03-23: qty 2

## 2022-03-23 MED ORDER — FENTANYL CITRATE (PF) 50 MCG/ML IJ SOLN (WRAP)
INTRAMUSCULAR | Status: AC
Start: 2022-03-23 — End: 2022-03-23
  Administered 2022-03-23: 25 ug via INTRAVENOUS
  Filled 2022-03-23: qty 2

## 2022-03-23 MED ORDER — FENTANYL CITRATE (PF) 50 MCG/ML IJ SOLN (WRAP)
25.0000 ug | INTRAMUSCULAR | Status: AC | PRN
Start: 2022-03-23 — End: 2022-03-23
  Administered 2022-03-23 (×3): 25 ug via INTRAVENOUS

## 2022-03-23 MED ORDER — ONDANSETRON HCL 4 MG/2ML IJ SOLN
INTRAMUSCULAR | Status: AC
Start: 2022-03-23 — End: ?
  Filled 2022-03-23: qty 2

## 2022-03-23 SURGICAL SUPPLY — 46 items
ADHESIVE LIQUID WATERPROOF VIAL PREP NONSTAIN MASTISOL STYRAX GUM (Skin Closure) ×1 IMPLANT
ADHESIVE LQ STYRAX GUM MASTIC ALC MTHY (Skin Closure) ×1
BLADE 11 BARD-PARKER RIB-BACK SAFETYLOCK (Blade) ×1
BLADE 11 BARD-PARKER RIB-BACK SAFETYLOCK CARBON STEEL SURGICAL (Blade) ×1 IMPLANT
BLADE SHAVER OD3.5 MM STRAIGHT SHAFT L7 (Ortho Supply) ×1
BLADE SHAVER OD3.5 MM STRAIGHT SHAFT L7 CM BEIGE (Ortho Supply) ×1 IMPLANT
BLADE SHVR DYNC 3.5MM 7CM STRL STRG SHFT (Ortho Supply) ×1
BLADE SRG CBNSTL 11 BP RB-BCK SFLOK LF (Blade) ×1
DRAPE SRG SMS .75 PRXM 77X53IN LF STRL (Drape) ×1
DRAPE SRG TBRN CNVRT 130X87-117IN LF (Drape) ×1
DRAPE SURGICAL REINFORCEMENT HEAVY DUTY (Drape) ×1
DRAPE SURGICAL REINFORCEMENT HEAVY DUTY CORD HOLD TAB L130 IN X (Drape) ×1 IMPLANT
DRAPE SURGICAL SHEET L77 IN X W53 IN (Drape) ×1
DRAPE SURGICAL SHEET L77 IN X W53 IN MEDLINE SMS 3/4 BLUE (Drape) ×1 IMPLANT
DRESSING PETRO 3% BI 3BRM GZE XR 8X1IN (Dressing) ×1
DRESSING PETROLATUM XEROFORM L8 IN X W1 (Dressing) ×1
DRESSING PETROLATUM XEROFORM L8 IN X W1 IN 3% BISMUTH TRIBROMOPHENATE (Dressing) ×1 IMPLANT
DRESSING WND GZE KRLX 4.125YDX4.5IN LF (Dressing) ×1
DRESSING WOUND KERLIX L4.125 YD X W4.5 (Dressing) ×1
DRESSING WOUND KERLIX L4.125 YD X W4.5 IN 6 PLY ANTIMICROBIAL (Dressing) ×1 IMPLANT
GLOVE SRG PLISPRN 7.5 BGL PI LF STRL PF (Glove) ×1
GLOVE SRG PLISPRN 8 BGL PI INDCTR (Glove) ×1
GLOVE SURGICAL 7.5 BIOGEL PI POWDER FREE (Glove) ×1
GLOVE SURGICAL 7.5 BIOGEL PI POWDER FREE MICRO ROUGHENED BEAD CUFF (Glove) ×1 IMPLANT
GLOVE SURGICAL 8 BIOGEL PI INDICATOR (Glove) ×1
GLOVE SURGICAL 8 BIOGEL PI INDICATOR UNDERGLOVE POWDER FREE SMOOTH (Glove) ×1 IMPLANT
GOWN SRG 2XL XLNG SMARTGOWN LF STRL LVL (Gown) ×1
GOWN SURGICAL 2XL XLONG SMARTGOWN LEVEL 4 RAGLAN SLEEVE BREATHABLE (Gown) ×1 IMPLANT
KIT RM TURNOVER LF NS DISP (Kits) ×1
KIT ROOM TURNOVER NONSTERILE LATEX FREE DISPOSABLE (Kits) ×1 IMPLANT
PAD ABD PVC CRTY 9X5IN LF STRL 3 LYR (Dressing) ×1 IMPLANT
SET L10 FT INFLOW 10K TUBING ARTHROSCOPY (Tubing) ×1 IMPLANT
SET TUBING 10K 10FT LF STRL INFL DISP (Tubing) ×1
SOLUTION IRR 0.9% NACL 3L URMTC LF PLS (Irrigation Solutions) ×2
SOLUTION IRRIGATION 0.9% SODIUM CHLORIDE 3000 ML PLASTIC CONTAINER (Irrigation Solutions) ×2 IMPLANT
SOLUTION SRGPRP 74% ISPRP 0.7% IOD (Prep) ×2
SOLUTION SURGICAL PREP 26 ML DURAPREP (Prep) ×2
SOLUTION SURGICAL PREP 26 ML DURAPREP 74% ISOPROPYL ALCOHOL 0.7% (Prep) ×2 IMPLANT
STRIP SKIN CLOSURE L4 IN X W1/2 IN (Dressing) ×1
STRIP SKIN CLOSURE L4 IN X W1/2 IN REINFORCE STERI-STRIP POLYESTER (Dressing) ×1 IMPLANT
STRIP SKNCLS PLSTR STRSTRP 4X.5IN LF (Dressing) ×1
SUTURE ABS 3-0 PS2 MNCRL MTPS 27IN MFL (Suture) ×1
SUTURE MONOCRYL 3-0 PS-2 L27 IN (Suture) ×1
SUTURE MONOCRYL 3-0 PS-2 L27 IN MONOFILAMENT UNDYED ABSORBABLE (Suture) ×1 IMPLANT
TRAY SRG ARTHROSCOPY IAH (Pack) ×1
TRAY SURGICAL ARTHROSCOPY (Pack) ×1 IMPLANT

## 2022-03-23 NOTE — Progress Notes (Signed)
Patient has a family history of Malignant Hyperthermia. OR Charge and Dr. Nigel Sloop notified

## 2022-03-23 NOTE — Anesthesia Postprocedure Evaluation (Signed)
Anesthesia Post Evaluation    Patient: Ernest May    Procedure(s):  RIGHT ARTHROSCOPY KNEE, MEDIAL MENISECTOMY    Anesthesia type: general    Last Vitals:   Vitals Value Taken Time   BP 107/77 03/23/22 1420   Temp 36.8 C (98.3 F) 03/23/22 1420   Pulse 57 03/23/22 1420   Resp 17 03/23/22 1420   SpO2 97 % 03/23/22 1420                 Anesthesia Post Evaluation:     Patient Evaluated: PACU    Level of Consciousness: awake    Pain Management: adequate    Airway Patency: patent        Anesthetic complications: No      PONV Status: none    Cardiovascular status: acceptable  Respiratory status: acceptable  Hydration status: acceptable          Signed by: Orvan Seen, MD, 03/23/2022 3:27 PM

## 2022-03-23 NOTE — H&P (Signed)
ORTHO H and P  Pt seen and examined. No changes. Plan right knee arthroscopy for medial meniscectomy.

## 2022-03-23 NOTE — Progress Notes (Signed)
Crutches for partial wt bearing given via demonstration

## 2022-03-23 NOTE — Brief Op Note (Signed)
BRIEF OP NOTE    Date Time: 03/23/22 12:58 PM    Patient Name:   Ernest May, Ernest May (MRN: [id number])    Date of Operation:   03/23/2022     Providers Performing:   Surgeon(s) and Role:     * Theador Hawthorne, MD - Primary    Surgical First Assistant(s):   First Assistant: Lerry Paterson    Operative Procedure:   RIGHT ARTHROSCOPY KNEE, MEDIAL MENISECTOMY: 29881 (CPT)    Preoperative Diagnosis:   Pre-Op Diagnosis Codes:     * Bucket handle tear of medial meniscus of right knee, initial encounter [S83.211A]    Postoperative Diagnosis:   Post-Op Diagnosis Codes:     * Bucket handle tear of medial meniscus of right knee, initial encounter [S83.211A]    Findings:   Right knee medial meniscus tear    Anesthesia:   General    Estimated Blood Loss:    * No values recorded between 03/23/2022 12:18 PM and 03/23/2022 12:58 PM *    Implants:   * No implants in log *        Drains:   Drains: no            Specimens:   * No specimens in log *       Complications:   None     Signed by: Theador Hawthorne, MD                                                                           [facility]

## 2022-03-23 NOTE — Anesthesia Preprocedure Evaluation (Addendum)
Anesthesia Evaluation    AIRWAY    Mallampati: II    TM distance: >3 FB  Neck ROM: full  Mouth Opening:full  Planned to use difficult airway equipment: No CARDIOVASCULAR    regular       DENTAL    no notable dental hx               PULMONARY    clear to auscultation     OTHER FINDINGS                                        Relevant Problems   No relevant active problems               Anesthesia Plan    ASA 2     general               (41 year old male with pmhx of HTN and asthma presenting for right arthroscopy knee, medial menisectomy.  -NPO  -Multiple family members with history of malignant hyperthermia  -OR machines flushed 20 mins, new circuit placed, MH filters place. Sevo and Des removed from the OR room  )      intravenous induction   Detailed anesthesia plan: general LMA      Post Op: plan for postoperative opioid use  Trial extubation is planned.    Post op pain management: per surgeon    informed consent obtained    Plan discussed with CRNA.      pertinent labs reviewed               Signed by: Orvan Seen, MD 03/23/22 8:07 AM

## 2022-03-23 NOTE — Discharge Instr - AVS First Page (Addendum)
Reason for your Hospital Admission:    RIGHT ARTHROSCOPY KNEE, MEDIAL MENISECTOMY - Right     Instructions for after your discharge:    Please refer to instructions from Dr. Selmer [facility] Anesthesia Discharge Instructions    Although you may be awake and alert in the recovery room, small amounts of anesthetic remain in your system for about 24 hours.  You may feel tired and sleepy during this time.      You are advised to go directly home from the hospital.    Plan to stay at home and rest for the remainder of the day.    It is advisable to have someone with you at home for 24 hours after surgery.    Do not operate a motor vehicle, or any mechanical or electrical equipment for the next 24 hours.      Be careful when you are walking around, you may become dizzy.  The effects of anesthesia and/or medications are still present and drowsiness may occur    Do not consume alcohol, tranquilizers, sleeping medications, or any other non prescribed medication for the remainder of the day.    Diet:  begin with liquids, progress your diet as tolerated or as directed by your surgeon.  Nausea and vomiting may occur in the next 24 hours.

## 2022-03-23 NOTE — Transfer of Care (Signed)
Anesthesia Transfer of Care Note    Patient: Ernest May    Procedures performed: Procedure(s):  RIGHT ARTHROSCOPY KNEE, MEDIAL MENISECTOMY    Anesthesia type: General LMA    Patient location:PACU    Last vitals:   Vitals:    03/23/22 1313   BP: 115/72   Pulse: 71   Resp: 17   Temp: 36.5 C (97.7 F)   SpO2: 98%       Post pain: Patient not complaining of pain, continue current therapy      Mental Status: sleepy    Respiratory Function: tolerating room air    Cardiovascular: stable    Nausea/Vomiting: patient not complaining of nausea or vomiting    Hydration Status: adequate    Post assessment: no apparent anesthetic complications    Signed by: Sinclair Grooms, CRNA  03/23/22 1:13 PM

## 2022-03-23 NOTE — Op Note (Addendum)
Procedure Date: 03/23/2022    Patient Type: A    SURGEON: Theador Hawthorne, MD    FIRST ASSISTANT:  Lenise Arena    PREOPERATIVE DIAGNOSIS:  Right knee bucket handle tear, medial meniscus.    POSTOPERATIVE DIAGNOSIS:  Right knee bucket handle tear, medial meniscus.    TITLE OF PROCEDURES:  Right knee arthroscopy with partial medial meniscectomy.    ANESTHESIA:  Laryngeal mask anesthesia, Dr. Nigel Sloop.    INDICATIONS FOR PROCEDURE:  Ernest May is a 41 year old male who has a locked bucket handle tear of his   medial meniscus of his right knee.  He now presents for a diagnostic   arthroscopy of his right knee with presumed medial meniscectomy.    DESCRIPTION OF PROCEDURE:  The patient was brought to the operating room and placed in supine   position.  After adequate laryngeal mask anesthesia simply with propofol   and fentanyl, right lower extremity was then prepped and draped in sterile   fashion.  Bony landmarks of right knee marked with pen.  Esmarch bandage   was used to exsanguinate the right lower extremity.  Tourniquet was   inflated to 300 mmHg.  Standard medial and lateral portals made with an 11   blade.  Scope inserted into the lateral portal into the suprapatellar   pouch.  Articular surface of patellofemoral joint is actually pristine.    Scope down the medial gutter and medial hemijoint.  Medial hemijoint shows   he has a locked bucket handle tear of his medial meniscus.  This is in a   white-white junction and this was not repairable and so this was resected   with a straight biter and 3.25mm full radius resector.  This represents about 60% removal   of the width of his medial meniscus.  Medial femoral condyle, medial tibial  plateau are pristine.  Intercondylar notch viewed next.  ACL and PCL   normal, knee was placed in the figure-of-four position, lateral hemijoint, looks   perfect.  Lateral meniscus pristine, lateral femoral condyle and lateral   tibial plateau pristine.  Lateral gutter free of  loose bodies, no   osteophytes seen.  Popliteus intact.  At this point, knee straightened,   scope removed, portals closed with subcuticular stitch of 3-0 Monocryl.    Steri-Strips applied, sterile compressive dressing was applied.  Knee   injected with 10 mL of 0.25% Marcaine plain.  Tourniquet was deflated.    Total tourniquet time 10 minutes.  Tolerated the procedure well and was   taken to recovery room in stable condition.      D: 03/23/2022 01:01 PM by Theador Hawthorne, MD [phone])  T: 03/23/2022 04:45 PM by [id number] (Conf: [id number]) (Doc ID: [id number])

## 2022-03-23 NOTE — Progress Notes (Addendum)
Discharge criteria met. Patient is awake and alert. Pain level is controlled to tolerable level. Respirations easy and non labored. All vital signs remain stable. Surgical dressing clean dry and intact. Discharge instructions printed and discussed with patient and family, both acknowledged understanding of instructions.Copy of instructions provided to patient. Peripheral IV removed. Patient escorted to main lobby by RN via wheelchair, family member to drive patient home.

## 2022-03-24 ENCOUNTER — Encounter: Payer: Self-pay | Admitting: Orthopaedic Surgery
